# Patient Record
Sex: Male | Born: 2005 | Race: Black or African American | Hispanic: No | Marital: Single | Smoking: Current every day smoker
Health system: Southern US, Community
[De-identification: ages and names within clinical notes are randomized; demographics above are authoritative.]

## PROBLEM LIST (undated history)

## (undated) DIAGNOSIS — F39 Unspecified mood [affective] disorder: Secondary | ICD-10-CM

## (undated) DIAGNOSIS — F913 Oppositional defiant disorder: Secondary | ICD-10-CM

## (undated) DIAGNOSIS — F909 Attention-deficit hyperactivity disorder, unspecified type: Secondary | ICD-10-CM

---
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
## 2023-01-07 ENCOUNTER — Encounter (HOSPITAL_COMMUNITY): Payer: Self-pay

## 2023-01-07 ENCOUNTER — Ambulatory Visit (HOSPITAL_COMMUNITY)
Admission: EM | Admit: 2023-01-07 | Discharge: 2023-01-07 | Disposition: A | Discharge status: Home / Self Care | Payer: Medicaid Other

## 2023-01-07 NOTE — ED Triage Notes (Signed)
Pt is here for dental pain and pressure. Denies taking any medications at this time.

## 2023-01-15 ENCOUNTER — Ambulatory Visit (HOSPITAL_COMMUNITY)
Admission: RE | Admit: 2023-01-15 | Discharge: 2023-01-15 | Disposition: A | Discharge status: Home / Self Care | Payer: Medicaid Other | Source: Ambulatory Visit | Attending: Physician Assistant | Admitting: Physician Assistant

## 2023-01-15 ENCOUNTER — Encounter (HOSPITAL_COMMUNITY): Payer: Self-pay

## 2023-01-15 ENCOUNTER — Other Ambulatory Visit: Payer: Self-pay

## 2023-01-15 VITALS — BP 124/82 | HR 68 | Temp 97.5°F | Resp 18

## 2023-01-15 DIAGNOSIS — K047 Periapical abscess without sinus: Secondary | ICD-10-CM

## 2023-01-15 DIAGNOSIS — K0889 Other specified disorders of teeth and supporting structures: Secondary | ICD-10-CM

## 2023-01-15 MED ORDER — NAPROXEN SODIUM 550 MG PO TABS: 550.0000 mg | ORAL_TABLET | Freq: Two times a day (BID) | ORAL | 1 refills | Status: DC

## 2023-01-15 MED ORDER — PENICILLIN V POTASSIUM 500 MG PO TABS: 500.0000 mg | ORAL_TABLET | Freq: Four times a day (QID) | ORAL | 0 refills | Status: AC

## 2023-01-15 NOTE — Discharge Instructions (Signed)
Advised take the Pen-Vee K 500 mg, 1 every 6 hours until completed to treat infection, or 2 in the morning 2 in the evening. Advised take Anaprox DS 550 mg, 1 every 12 hours with food to help decrease pain and swelling.  Advised to use warm salt water gargles, 1 teaspoon of salt 3 to 4 ounces of water, gargle for 60 seconds then spit, 3-4 times throughout the day to help reduce pain and discomfort.  Advised to review the dental resource sheet and to call dentist to arrange an appointment to have the area evaluated.  Advised follow-up PCP return to urgent care as needed.

## 2023-01-15 NOTE — ED Triage Notes (Signed)
Dental pain for 2 weeks.  Patient does not have a dentist.  Pain is right bottom side of mouth.    Patient has not had any medications

## 2023-01-15 NOTE — ED Provider Notes (Signed)
[facility]    CSN: [id number] Arrival date & time: 01/15/23  1123      History   Chief Complaint Chief Complaint  Patient presents with   Dental Pain    HPI Jesus Stokes is a 17 y.o. male.   17 year old male presents with right lower tooth pain.  Patient indicates for the past 2 weeks he has been having progressive right lower tooth pain and discomfort at the wisdom tooth area.  He relates that the tooth is pushing through the gum and causing pain and discomfort.  He relates that the gum is swollen at the area.  He indicates he is having considerable pain on trying to eat and chew on the right side.  Patient indicates he has not taken any OTC medicines.  He is without fever or chills.  Patient indicates he does not have a dentist.   Dental Pain   Past Medical History:  Diagnosis Date   ADHD (attention deficit hyperactivity disorder)    Mood disorder    Oppositional defiant disorder     There are no problems to display for this patient.   History reviewed. No pertinent surgical history.     Home Medications    Prior to Admission medications   Medication Sig Start Date End Date Taking? Authorizing Provider  naproxen sodium (ANAPROX DS) 550 MG tablet Take 1 tablet (550 mg total) by mouth 2 (two) times daily with a meal. 01/15/23  Yes Ellsworth Lennox, PA-C  penicillin v potassium (VEETID) 500 MG tablet Take 1 tablet (500 mg total) by mouth 4 (four) times daily for 10 days. 01/15/23 01/25/23 Yes Ellsworth Lennox, PA-C  diphenhydrAMINE (BENADRYL) 12.5 MG/5ML elixir Take 10 mLs (25 mg total) by mouth every 6 (six) hours. For 24 hours 05/08/19   Dahlia Byes A, NP  ondansetron (ZOFRAN ODT) 4 MG disintegrating tablet Take 1 tablet (4 mg total) by mouth every 8 (eight) hours as needed for nausea or vomiting. 08/28/20   Sharene Skeans, MD  ranitidine (ZANTAC) 75 MG tablet Take 1 tablet (75 mg total) by mouth 2 (two) times daily. Patient not taking: Reported on 05/08/2019 08/03/17  05/08/19  Mardella Layman, MD    Family History History reviewed. No pertinent family history.  Social History Social History   Tobacco Use   Smoking status: Never    Passive exposure: Yes   Smokeless tobacco: Never  Vaping Use   Vaping Use: Never used  Substance Use Topics   Alcohol use: Yes    Comment: rare   Drug use: Yes    Types: Marijuana     Allergies   Banana, Grapeseed extract [nutritional supplements], Other, and Shellfish allergy   Review of Systems Review of Systems  HENT:  Positive for dental problem (right lower wisdom tooth area).      Physical Exam Triage Vital Signs ED Triage Vitals  Enc Vitals Group     BP 01/15/23 1140 124/82     Pulse Rate 01/15/23 1140 68     Resp 01/15/23 1140 18     Temp 01/15/23 1140 (!) 97.5 F (36.4 C)     Temp Source 01/15/23 1140 Oral     SpO2 01/15/23 1140 100 %     Weight --      Height --      Head Circumference --      Peak Flow --      Pain Score 01/15/23 1139 6     Pain Loc --  Pain Edu? --      Excl. in GC? --    No data found.  Updated Vital Signs BP 124/82 (BP Location: Left Arm)   Pulse 68   Temp (!) 97.5 F (36.4 C) (Oral)   Resp 18   SpO2 100%   Visual Acuity Right Eye Distance:   Left Eye Distance:   Bilateral Distance:    Right Eye Near:   Left Eye Near:    Bilateral Near:     Physical Exam Constitutional:      Appearance: Normal appearance.  HENT:     Mouth/Throat:      Comments: Mouth: Buccal mucosa is normal, right lower wisdom tooth with gum swollen over the area and the tooth is breaking through.  There is no drainage from the area.  Tenderness is palpated on the outside lower right jaw adjacent to the area. Lymphadenopathy:     Cervical: No cervical adenopathy.  Neurological:     Mental Status: He is alert.      UC Treatments / Results  Labs (all labs ordered are listed, but only abnormal results are displayed) Labs Reviewed - No data to  display  EKG   Radiology No results found.  Procedures Procedures (including critical care time)  Medications Ordered in UC Medications - No data to display  Initial Impression / Assessment and Plan / UC Course  I have reviewed the triage vital signs and the nursing notes.  Pertinent labs & imaging results that were available during my care of the patient were reviewed by me and considered in my medical decision making (see chart for details).    Plan: The diagnosis will be treated with the following: 1.  Dental pain: A.  Anaprox DS 550 mg every 12 hours with food to help decrease pain and swelling. 2.  Dental infection: A.  Pen-Vee K 500 mg, 1 every 6 hours or 2 and a morning 2 in the evening to treat infection. 3.  Dental resource sheet given to patient, patient advised to call dentist on the resource sheet and arrange an appointment to have the area evaluated. 4.  Patient advised return to urgent care as needed. Final Clinical Impressions(s) / UC Diagnoses   Final diagnoses:  Pain, dental  Dental infection     Discharge Instructions      Advised take the Pen-Vee K 500 mg, 1 every 6 hours until completed to treat infection, or 2 in the morning 2 in the evening. Advised take Anaprox DS 550 mg, 1 every 12 hours with food to help decrease pain and swelling.  Advised to use warm salt water gargles, 1 teaspoon of salt 3 to 4 ounces of water, gargle for 60 seconds then spit, 3-4 times throughout the day to help reduce pain and discomfort.  Advised to review the dental resource sheet and to call dentist to arrange an appointment to have the area evaluated.  Advised follow-up PCP return to urgent care as needed.    ED Prescriptions     Medication Sig Dispense Auth. Provider   penicillin v potassium (VEETID) 500 MG tablet Take 1 tablet (500 mg total) by mouth 4 (four) times daily for 10 days. 40 tablet Ellsworth Lennox, PA-C   naproxen sodium (ANAPROX DS) 550 MG tablet Take  1 tablet (550 mg total) by mouth 2 (two) times daily with a meal. 20 tablet Ellsworth Lennox, PA-C      PDMP not reviewed this encounter.   Ellsworth Lennox, PA-C 01/15/23  1157  

## 2023-10-16 ENCOUNTER — Encounter (HOSPITAL_COMMUNITY): Payer: Self-pay

## 2023-10-16 ENCOUNTER — Ambulatory Visit (HOSPITAL_COMMUNITY)
Admission: EM | Admit: 2023-10-16 | Discharge: 2023-10-16 | Disposition: A | Discharge status: Home / Self Care | Payer: Medicaid Other | Attending: Physician Assistant | Admitting: Physician Assistant

## 2023-10-16 DIAGNOSIS — K0889 Other specified disorders of teeth and supporting structures: Secondary | ICD-10-CM | POA: Diagnosis not present

## 2023-10-16 DIAGNOSIS — K029 Dental caries, unspecified: Secondary | ICD-10-CM | POA: Diagnosis not present

## 2023-10-16 MED ORDER — AMOXICILLIN-POT CLAVULANATE 875-125 MG PO TABS: 1.0000 | ORAL_TABLET | Freq: Two times a day (BID) | ORAL | 0 refills | Status: AC

## 2023-10-16 MED ORDER — IBUPROFEN 800 MG PO TABS: 800.0000 mg | ORAL_TABLET | Freq: Three times a day (TID) | ORAL | 0 refills | Status: AC | PRN

## 2023-10-16 MED ORDER — CHLORHEXIDINE GLUCONATE 0.12 % MT SOLN: 15.0000 mL | Freq: Two times a day (BID) | OROMUCOSAL | 0 refills | Status: AC

## 2023-10-16 NOTE — ED Triage Notes (Signed)
 Patient here today with c/o right lower dental abscess with swelling X 2 days. Worsened yesterday. Patient having trouble chewing.

## 2023-10-16 NOTE — Discharge Instructions (Addendum)
 Good to meet you.  You have a dental infection.  Please take the Augmentin  and ibuprofen  as directed.  Use the Peridex  solution to help with cleansing the mouth.  Soft foods are recommended until symptoms improve.  You need to stop smoking.  You need to see a dentist.  Please refer to the list provided.

## 2023-10-16 NOTE — ED Provider Notes (Signed)
  [facility]   MRN: [id number] DOB: [birth year]  Subjective:   Jesus Stokes is a 18 y.o. male presenting for right lower dental pain with mild swelling for the last 2 days.  He is here with his father.  States that he is having difficulty with chewing foods.  Thinks the last time he saw the dentist was in elementary school.  Does not brush regularly.  He smokes cigarettes and marijuana.  Denies any fever or chills.  No taste of pus.  No bleeding of the gums.  No current facility-administered medications for this encounter.  Current Outpatient Medications:    amoxicillin -clavulanate (AUGMENTIN ) 875-125 MG tablet, Take 1 tablet by mouth every 12 (twelve) hours for 10 days. Take with food., Disp: 20 tablet, Rfl: 0   chlorhexidine  (PERIDEX ) 0.12 % solution, Use as directed 15 mLs in the mouth or throat 2 (two) times daily. Swish and spit., Disp: 120 mL, Rfl: 0   ibuprofen  (ADVIL ) 800 MG tablet, Take 1 tablet (800 mg total) by mouth every 8 (eight) hours as needed for moderate pain (pain score 4-6)., Disp: 21 tablet, Rfl: 0   Allergies  Allergen Reactions   Banana Swelling   Grapeseed Extract [Nutritional Supplements] Swelling   Other Swelling    Pt allergic to flowers, weeds, dogs, roaches, and has seasonal allergies.    Shellfish Allergy Swelling    Past Medical History:  Diagnosis Date   ADHD (attention deficit hyperactivity disorder)    Mood disorder (HCC)    Oppositional defiant disorder      History reviewed. No pertinent surgical history.  History reviewed. No pertinent family history.  Social History   Tobacco Use   Smoking status: Every Day    Types: Cigarettes    Passive exposure: Yes   Smokeless tobacco: Never  Vaping Use   Vaping status: Never Used  Substance Use Topics   Alcohol use: Not Currently    Comment: rare   Drug use: Yes    Types: Marijuana    ROS REFER TO HPI FOR PERTINENT POSITIVES AND NEGATIVES   Objective:    Vitals: BP 130/76 (BP Location: Right Arm)   Pulse 79   Temp 97.8 F (36.6 C) (Oral)   Resp 16   Ht 5' 8 (1.727 m)   SpO2 100%   Physical Exam Vitals and nursing note reviewed.  Constitutional:      Appearance: Normal appearance.  HENT:     Mouth/Throat:      Comments: Poor dentition diffusely  Cardiovascular:     Rate and Rhythm: Normal rate and regular rhythm.  Pulmonary:     Effort: Pulmonary effort is normal.     Breath sounds: Normal breath sounds.  Neurological:     Mental Status: He is alert.  Psychiatric:        Mood and Affect: Mood normal.     No results found for this or any previous visit (from the past 24 hours).  Assessment and Plan :   PDMP not reviewed this encounter.  1. Pain, dental   2. Dental caries    Dental infection noted without abscess.  Patient provided with prescriptions for Augmentin , ibuprofen , Peridex  solution to take as directed.  Strongly advised to quit smoking and depression regularly.  Dental list provided for him. Pt aware of risks vs benefits and possible adverse reactions. RTC precautions discussed.     AllwardtMardy HERO, PA-C 10/16/23 1055
